# Patient Record
Sex: Male | Born: 2000 | Race: Black or African American | Hispanic: No | Marital: Single | Smoking: Current every day smoker
Health system: Southern US, Community
[De-identification: ages and names within clinical notes are randomized; demographics above are authoritative.]

## PROBLEM LIST (undated history)

## (undated) DIAGNOSIS — J45909 Unspecified asthma, uncomplicated: Secondary | ICD-10-CM

---
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
## 2018-09-22 ENCOUNTER — Emergency Department (HOSPITAL_BASED_OUTPATIENT_CLINIC_OR_DEPARTMENT_OTHER)
Admission: EM | Admit: 2018-09-22 | Discharge: 2018-09-22 | Disposition: A | Discharge status: Home / Self Care | Payer: Medicaid Other | Attending: Emergency Medicine | Admitting: Emergency Medicine

## 2018-09-22 ENCOUNTER — Encounter (HOSPITAL_BASED_OUTPATIENT_CLINIC_OR_DEPARTMENT_OTHER): Payer: Self-pay | Admitting: *Deleted

## 2018-09-22 ENCOUNTER — Other Ambulatory Visit: Payer: Self-pay

## 2018-09-22 ENCOUNTER — Emergency Department (HOSPITAL_BASED_OUTPATIENT_CLINIC_OR_DEPARTMENT_OTHER): Payer: Medicaid Other

## 2018-09-22 DIAGNOSIS — Z79899 Other long term (current) drug therapy: Secondary | ICD-10-CM | POA: Insufficient documentation

## 2018-09-22 DIAGNOSIS — F17200 Nicotine dependence, unspecified, uncomplicated: Secondary | ICD-10-CM | POA: Insufficient documentation

## 2018-09-22 DIAGNOSIS — J45909 Unspecified asthma, uncomplicated: Secondary | ICD-10-CM | POA: Diagnosis not present

## 2018-09-22 DIAGNOSIS — R059 Cough, unspecified: Secondary | ICD-10-CM

## 2018-09-22 DIAGNOSIS — R05 Cough: Secondary | ICD-10-CM | POA: Insufficient documentation

## 2018-09-22 HISTORY — DX: Unspecified asthma, uncomplicated: J45.909

## 2018-09-22 MED ORDER — FLUTICASONE PROPIONATE 50 MCG/ACT NA SUSP: 1.0000 | Freq: Every day | NASAL | 0 refills | Status: AC

## 2018-09-22 MED ORDER — BENZONATATE 100 MG PO CAPS: 100.0000 mg | ORAL_CAPSULE | Freq: Three times a day (TID) | ORAL | 0 refills | Status: AC

## 2018-09-22 NOTE — ED Triage Notes (Signed)
Cough x 2 weeks. Cough is worse at night.

## 2018-09-22 NOTE — Discharge Instructions (Addendum)
You were seen in the emergency today for a cough- your chest xray did not show pneumonia, we suspect your symptoms are related to allergies or a virus at this time.  I have prescribed you multiple medications to treat your symptoms.   -Flonase to be used 1 spray in each nostril daily.  This medication is used to treat your congestion.  -Tessalon can be taken once every 8 hours as needed.  This medication is used to treat your cough.  We have prescribed you new medication(s) today. Discuss the medications prescribed today with your pharmacist as they can have adverse effects and interactions with your other medicines including over the counter and prescribed medications. Seek medical evaluation if you start to experience new or abnormal symptoms after taking one of these medicines, seek care immediately if you start to experience difficulty breathing, feeling of your throat closing, facial swelling, or rash as these could be indications of a more serious allergic reaction  Continue to use your inhaler at home for any wheezing or shortness of breath.  You will need to follow-up with your primary care provider in 1 week if your symptoms have not improved.  If you do not have a primary care provider one is provided in your discharge instructions.  Return to the emergency department for any new or worsening symptoms including but not limited to persistent fever for 5 days, difficulty breathing, chest pain, rashes, passing out, or any other concerns.

## 2018-09-22 NOTE — ED Provider Notes (Signed)
[facility] EMERGENCY DEPARTMENT Provider Note   CSN: [id number] Arrival date & time: 09/22/18  1209     History   Chief Complaint Chief Complaint  Patient presents with  . Cough    HPI Tom Miller is a 17 y.o. male with a hx of asthma and tobacco abuse who presents to the ED with complaints of cough for the past 2 weeks. Patient reports productive cough with clear to green mucous. He states sxs are constant, worse at night, otherwise no specific alleviating/aggravating factors, no intervention tried PTA. He has hx of asthma, denies dyspnea, chest pain, or wheezing, has not had to utilize his inhaler. Denies fever, chills, congestion, ear pain, or sore throat.   HPI  Past Medical History:  Diagnosis Date  . Asthma     There are no active problems to display for this patient.   History reviewed. No pertinent surgical history.      Home Medications    Prior to Admission medications   Medication Sig Start Date End Date Taking? Authorizing Provider  albuterol (PROVENTIL HFA;VENTOLIN HFA) 108 (90 BASE) MCG/ACT inhaler Inhale 2 puffs into the lungs every 6 (six) hours as needed. For shortness of breath   Yes [provider]  Fluticasone-Salmeterol (ADVAIR) 250-50 MCG/DOSE AEPB Inhale 1 puff into the lungs every 12 (twelve) hours.   Yes [provider]  albuterol (PROVENTIL) (2.5 MG/3ML) 0.083% nebulizer solution Take 2.5 mg by nebulization every 6 (six) hours as needed. For shortness of breath    [provider]  fluticasone (FLOVENT HFA) 44 MCG/ACT inhaler Inhale 2 puffs into the lungs 2 (two) times daily.    [provider]  predniSONE (DELTASONE) 10 MG tablet Take 2 tablets (20 mg total) by mouth daily. 06/22/12   Margarita Grizzleay, Danielle, MD    Family History No family history on file.  Social History Social History   Tobacco Use  . Smoking status: Current Every Day Smoker  . Smokeless tobacco: Never Used  Substance Use Topics   . Alcohol use: Never    Frequency: Never  . Drug use: Never     Allergies   Patient has no known allergies.   Review of Systems Review of Systems  Constitutional: Negative for chills and fever.  HENT: Negative for congestion, ear pain and sore throat.   Respiratory: Positive for cough. Negative for choking, chest tightness, shortness of breath and wheezing.   Cardiovascular: Negative for chest pain.  Gastrointestinal: Negative for nausea and vomiting.     Physical Exam Updated Vital Signs BP 131/79   Pulse 64   Temp 98.4 F (36.9 C) (Oral)   Resp 20   Ht 5\' 7"  (1.702 m)   Wt 77.1 kg   SpO2 98%   BMI 26.63 kg/m   Physical Exam  Constitutional: He appears well-developed and well-nourished.  Non-toxic appearance. No distress.  HENT:  Head: Normocephalic and atraumatic.  Right Ear: Tympanic membrane and ear canal normal. Tympanic membrane is not perforated, not erythematous, not retracted and not bulging.  Left Ear: Tympanic membrane and ear canal normal. Tympanic membrane is not perforated, not erythematous, not retracted and not bulging.  Nose: Nose normal.  Mouth/Throat: Uvula is midline and oropharynx is clear and moist. No oropharyngeal exudate or posterior oropharyngeal erythema.  Eyes: Conjunctivae are normal. Right eye exhibits no discharge. Left eye exhibits no discharge.  Neck: Normal range of motion. Neck supple. No neck rigidity.  Cardiovascular: Normal rate and regular rhythm.  Pulmonary/Chest: Effort  normal and breath sounds normal. No stridor. No respiratory distress. He has no wheezes. He has no rhonchi. He has no rales.  Respiration even and unlabored  Abdominal: Soft. He exhibits no distension. There is no tenderness.  Lymphadenopathy:    He has no cervical adenopathy.  Neurological: He is alert.  Clear speech.   Skin: Skin is warm and dry. No rash noted.  Psychiatric: He has a normal mood and affect. His behavior is normal. Thought content normal.    Nursing note and vitals reviewed.    ED Treatments / Results  Labs (all labs ordered are listed, but only abnormal results are displayed) Labs Reviewed - No data to display  EKG None  Radiology Dg Chest 2 View  Result Date: 09/22/2018 CLINICAL DATA:  Two weeks of initially nonproductive cough which has become productive. History of asthma, current smoker. EXAM: CHEST - 2 VIEW COMPARISON:  Report of a chest x-ray of January 16, 2010 FINDINGS: The lungs are well-expanded. There is mild increase in the AP dimension of the thorax. The heart and pulmonary vascularity are normal. There is no pleural effusion. The mediastinum is normal in width. The bony thorax exhibits no acute abnormality. IMPRESSION: Mild hyperinflation consistent with reactive airway disease. No pneumonia nor other acute cardiopulmonary abnormality. Electronically Signed   By: David  [place] M.D.   On: 09/22/2018 13:05    Procedures Procedures (including critical care time)  Medications Ordered in ED Medications - No data to display   Initial Impression / Assessment and Plan / ED Course  I have reviewed the triage vital signs and the nursing notes.  Pertinent labs & imaging results that were available during my care of the patient were reviewed by me and considered in my medical decision making (see chart for details).   Patient presents to the ED with complaints of cough x 2 weeks. Patient nontoxic appearing, in no apparent distress, vitals WNL. On exam patient's lungs are clear to auscultation bilaterally and he does not appear to be in any respiratory distress. CXR obtained reveals findings consistent with reactive airway disease- no evidence of pneumonia, effusion, edema, or pneumothorax. Patient is not wheezing on my assessment, do not feel this is a true asthma exacerbation requiring steroids. Suspect viral vs. Allergic. Will provide prescription for tessalon and for flonase with use of inhaler PRN and PCP follow up.  I discussed results, treatment plan, need for follow-up, and return precautions with the patient and his grandmother at bedside. Provided opportunity for questions, patient and his grandmother confirmed understanding and are in agreement with plan.   Final Clinical Impressions(s) / ED Diagnoses   Final diagnoses:  Cough    ED Discharge Orders         Ordered    fluticasone (FLONASE) 50 MCG/ACT nasal spray  Daily     09/22/18 1325    benzonatate (TESSALON) 100 MG capsule  Every 8 hours     09/22/18 1325           Petrucelli, [place] R, PA-C 09/22/18 1326    Tegeler, Canary Brim, MD 09/22/18 [phone]

## 2018-10-20 ENCOUNTER — Encounter (HOSPITAL_BASED_OUTPATIENT_CLINIC_OR_DEPARTMENT_OTHER): Payer: Self-pay | Admitting: *Deleted

## 2018-10-20 ENCOUNTER — Emergency Department (HOSPITAL_BASED_OUTPATIENT_CLINIC_OR_DEPARTMENT_OTHER)
Admission: EM | Admit: 2018-10-20 | Discharge: 2018-10-20 | Disposition: A | Discharge status: Home / Self Care | Payer: Medicaid Other | Attending: Emergency Medicine | Admitting: Emergency Medicine

## 2018-10-20 ENCOUNTER — Emergency Department (HOSPITAL_BASED_OUTPATIENT_CLINIC_OR_DEPARTMENT_OTHER): Payer: Medicaid Other

## 2018-10-20 ENCOUNTER — Other Ambulatory Visit: Payer: Self-pay

## 2018-10-20 DIAGNOSIS — Z79899 Other long term (current) drug therapy: Secondary | ICD-10-CM | POA: Diagnosis not present

## 2018-10-20 DIAGNOSIS — J45901 Unspecified asthma with (acute) exacerbation: Secondary | ICD-10-CM | POA: Insufficient documentation

## 2018-10-20 DIAGNOSIS — J181 Lobar pneumonia, unspecified organism: Secondary | ICD-10-CM | POA: Insufficient documentation

## 2018-10-20 DIAGNOSIS — F17210 Nicotine dependence, cigarettes, uncomplicated: Secondary | ICD-10-CM | POA: Insufficient documentation

## 2018-10-20 DIAGNOSIS — J189 Pneumonia, unspecified organism: Secondary | ICD-10-CM

## 2018-10-20 DIAGNOSIS — R062 Wheezing: Secondary | ICD-10-CM | POA: Diagnosis present

## 2018-10-20 MED ORDER — PREDNISONE 50 MG PO TABS
60.0000 mg | ORAL_TABLET | Freq: Once | ORAL | Status: AC
  Administered 2018-10-20: 60 mg via ORAL
  Filled 2018-10-20: qty 1

## 2018-10-20 MED ORDER — IPRATROPIUM-ALBUTEROL 0.5-2.5 (3) MG/3ML IN SOLN
RESPIRATORY_TRACT | Status: AC
  Administered 2018-10-20: 3 mL
  Filled 2018-10-20: qty 3

## 2018-10-20 MED ORDER — ALBUTEROL SULFATE HFA 108 (90 BASE) MCG/ACT IN AERS
2.0000 | INHALATION_SPRAY | Freq: Once | RESPIRATORY_TRACT | Status: AC
  Administered 2018-10-20: 2 via RESPIRATORY_TRACT
  Filled 2018-10-20: qty 6.7

## 2018-10-20 MED ORDER — DOXYCYCLINE HYCLATE 100 MG PO TABS
100.0000 mg | ORAL_TABLET | Freq: Once | ORAL | Status: AC
  Administered 2018-10-20: 100 mg via ORAL
  Filled 2018-10-20: qty 1

## 2018-10-20 MED ORDER — DOXYCYCLINE HYCLATE 100 MG PO CAPS: 100.0000 mg | ORAL_CAPSULE | Freq: Two times a day (BID) | ORAL | 0 refills | Status: AC

## 2018-10-20 MED ORDER — ALBUTEROL SULFATE (2.5 MG/3ML) 0.083% IN NEBU
INHALATION_SOLUTION | RESPIRATORY_TRACT | Status: AC
  Administered 2018-10-20: 2.5 mg
  Filled 2018-10-20: qty 3

## 2018-10-20 NOTE — ED Triage Notes (Signed)
Wheezing. Hx of asthma. He ran out of his inhaler months ago per pt. Cough.

## 2018-10-20 NOTE — Discharge Instructions (Addendum)
Take your antibiotic as prescribed until it is completely gone. Use the albuterol every 4-6 hours as needed for shortness of breath or wheezing. Follow-up with your primary care provider. Return to the ER for new or worsening symptoms.

## 2018-10-20 NOTE — ED Provider Notes (Signed)
[facility] EMERGENCY DEPARTMENT Provider Note   CSN: [id number] Arrival date & time: 10/20/18  1633     History   Chief Complaint Chief Complaint  Patient presents with  . Asthma    HPI Tom Miller is a 17 y.o. male w PMHx asthma, presenting to the ED with complaint of wheezing that began today.  Patient states he has been getting over a cold for 1 month, symptoms consist of cough productive of white sputum as well as nasal congestion.  States that is causing his asthma to act up.  Intermittently treating his symptoms with Flonase as prescribed during previous ED visit on 09/22/2018.  He is takes Advair twice daily for his asthma, however has been out of albuterol for a couple of months now.  No fevers, sore throat, ear pain, difficulty swallowing.  He has not tried antihistamines.  The history is provided by the patient.    Past Medical History:  Diagnosis Date  . Asthma     There are no active problems to display for this patient.   History reviewed. No pertinent surgical history.      Home Medications    Prior to Admission medications   Medication Sig Start Date End Date Taking? Authorizing Provider  albuterol (PROVENTIL HFA;VENTOLIN HFA) 108 (90 BASE) MCG/ACT inhaler Inhale 2 puffs into the lungs every 6 (six) hours as needed. For shortness of breath    [provider]  benzonatate (TESSALON) 100 MG capsule Take 1 capsule (100 mg total) by mouth every 8 (eight) hours. 09/22/18   Petrucelli, Samantha R, PA-C  doxycycline (VIBRAMYCIN) 100 MG capsule Take 1 capsule (100 mg total) by mouth 2 (two) times daily for 7 days. 10/20/18 10/27/18  Alic Hilburn, [place] N, PA-C  fluticasone (FLONASE) 50 MCG/ACT nasal spray Place 1 spray into both nostrils daily. 09/22/18   Petrucelli, Samantha R, PA-C  Fluticasone-Salmeterol (ADVAIR) 250-50 MCG/DOSE AEPB Inhale 1 puff into the lungs every 12 (twelve) hours.    [provider]    Family History No  family history on file.  Social History Social History   Tobacco Use  . Smoking status: Current Every Day Smoker  . Smokeless tobacco: Never Used  Substance Use Topics  . Alcohol use: Never    Frequency: Never  . Drug use: Never     Allergies   Patient has no known allergies.   Review of Systems Review of Systems  Constitutional: Negative for fever.  HENT: Positive for congestion. Negative for ear pain, sore throat and trouble swallowing.   Respiratory: Positive for cough and wheezing.      Physical Exam Updated Vital Signs BP 129/76   Pulse 94   Temp 98.8 F (37.1 C) (Oral)   Resp 20   Ht 5\' 7"  (1.702 m)   Wt 77.1 kg   SpO2 97%   BMI 26.62 kg/m   Physical Exam Vitals signs and nursing note reviewed.  Constitutional:      Appearance: He is well-developed.  HENT:     Head: Normocephalic and atraumatic.     Right Ear: Tympanic membrane and ear canal normal.     Left Ear: Tympanic membrane and ear canal normal.     Nose: Congestion present.     Mouth/Throat:     Mouth: Mucous membranes are moist.     Pharynx: Oropharynx is clear.  Eyes:     Conjunctiva/sclera: Conjunctivae normal.  Neck:     Musculoskeletal: Normal range of motion and neck supple.  Cardiovascular:     Rate and Rhythm: Normal rate and regular rhythm.  Pulmonary:     Effort: Pulmonary effort is normal.     Comments: Mild expiratory wheezes throughout, some rhonchi LLL. Normal work of breathing. Speaking in full sentences Lymphadenopathy:     Cervical: No cervical adenopathy.  Neurological:     Mental Status: He is alert.  Psychiatric:        Mood and Affect: Mood normal.        Behavior: Behavior normal.      ED Treatments / Results  Labs (all labs ordered are listed, but only abnormal results are displayed) Labs Reviewed - No data to display  EKG None  Radiology Dg Chest 2 View  Result Date: 10/20/2018 CLINICAL DATA:  Initial evaluation for acute cough, congestion. EXAM:  CHEST - 2 VIEW COMPARISON:  Prior radiograph from 09/22/2018. FINDINGS: Cardiac and mediastinal silhouettes within normal limits. Lungs well inflated bilaterally. Hazy right lower infiltrate, suspicious for pneumonia. No other focal airspace disease. No edema or effusion. No pneumothorax. No acute osseous abnormality. IMPRESSION: Hazy right lower lobe infiltrate, suspicious for pneumonia. Electronically Signed   By: Rise Mu M.D.   On: 10/20/2018 17:49    Procedures Procedures (including critical care time)  Medications Ordered in ED Medications  doxycycline (VIBRA-TABS) tablet 100 mg (has no administration in time range)  albuterol (PROVENTIL HFA;VENTOLIN HFA) 108 (90 Base) MCG/ACT inhaler 2 puff (has no administration in time range)  albuterol (PROVENTIL) (2.5 MG/3ML) 0.083% nebulizer solution (2.5 mg  Given 10/20/18 1650)  ipratropium-albuterol (DUONEB) 0.5-2.5 (3) MG/3ML nebulizer solution (3 mLs  Given 10/20/18 1650)  predniSONE (DELTASONE) tablet 60 mg (60 mg Oral Given 10/20/18 1728)     Initial Impression / Assessment and Plan / ED Course  I have reviewed the triage vital signs and the nursing notes.  Pertinent labs & imaging results that were available during my care of the patient were reviewed by me and considered in my medical decision making (see chart for details).  Clinical Course as of Oct 20 1820  Fri Oct 20, 2018  1811 Suspicious for pneumonia. Will treat with doxycycline.  DG Chest 2 View [JR]    Clinical Course User Index [JR] Brycen Bean, [place] N, PA-C    Patient w hx of asthma, presenting with wheezing that began today, and cold sx for 1 month. Pt treated prior to evaluation by RT, with duoneb with improvement. States he is breathing at his baseline and no longer feels short of breath. Oxygenating at 97% on room air, normal work of breathing, speaking in full sentences.  Lungs with wheezes as well as rhonchi in the left, however CXR suspicious for  infiltrate in the RLL. ENT exam is unremarkable. Pt is not ill appearing, immunocompromised, and does not have multiple co morbidities, therefore I feel like the they can be treated as an OP with abx therapy. Pt has been advised to return to the ED if symptoms worsen or they do not improve. Pt verbalizes understanding and is agreeable with plan.   Discussed results, findings, treatment and follow up. Patient advised of return precautions. Patient verbalized understanding and agreed with plan.  Final Clinical Impressions(s) / ED Diagnoses   Final diagnoses:  Community acquired pneumonia of right lower lobe of lung (HCC)  Asthma with acute exacerbation, unspecified asthma severity, unspecified whether persistent    ED Discharge Orders         Ordered    doxycycline (VIBRAMYCIN) 100 MG  capsule  2 times daily     10/20/18 1821           Rhonda Linan, [place] N, [place]PA-C 10/20/18 1821    Sabas SousBero, Michael M, MD 10/20/18 [id number]

## 2020-12-11 IMAGING — CR DG CHEST 2V
2 series · 2 of 2 positions shown · non-contrast
Comparison: Prior radiograph from 09/22/2018.

CLINICAL DATA: Initial evaluation for acute cough, congestion.

EXAM:
CHEST - 2 VIEW

[w chest pa]
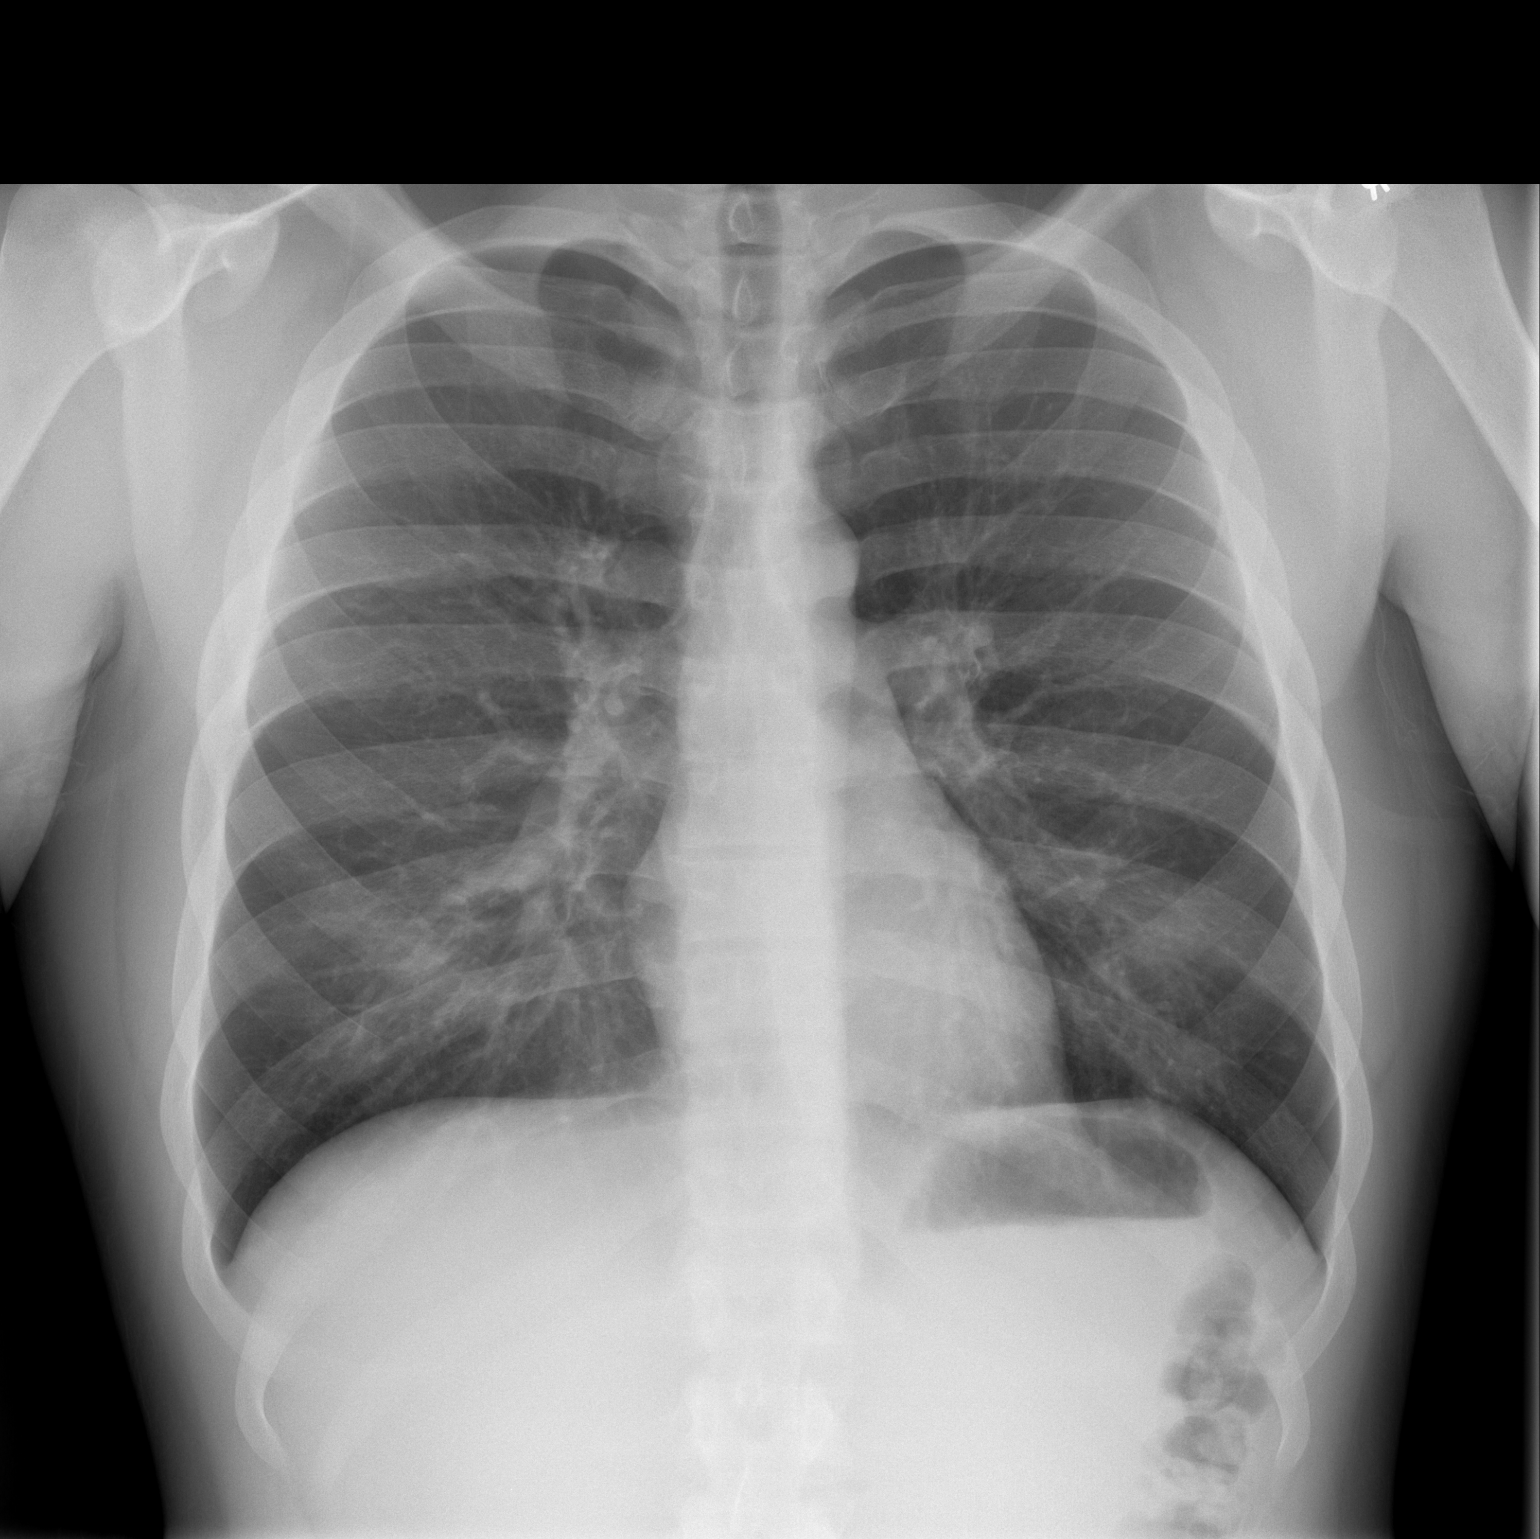

[w chest lat]
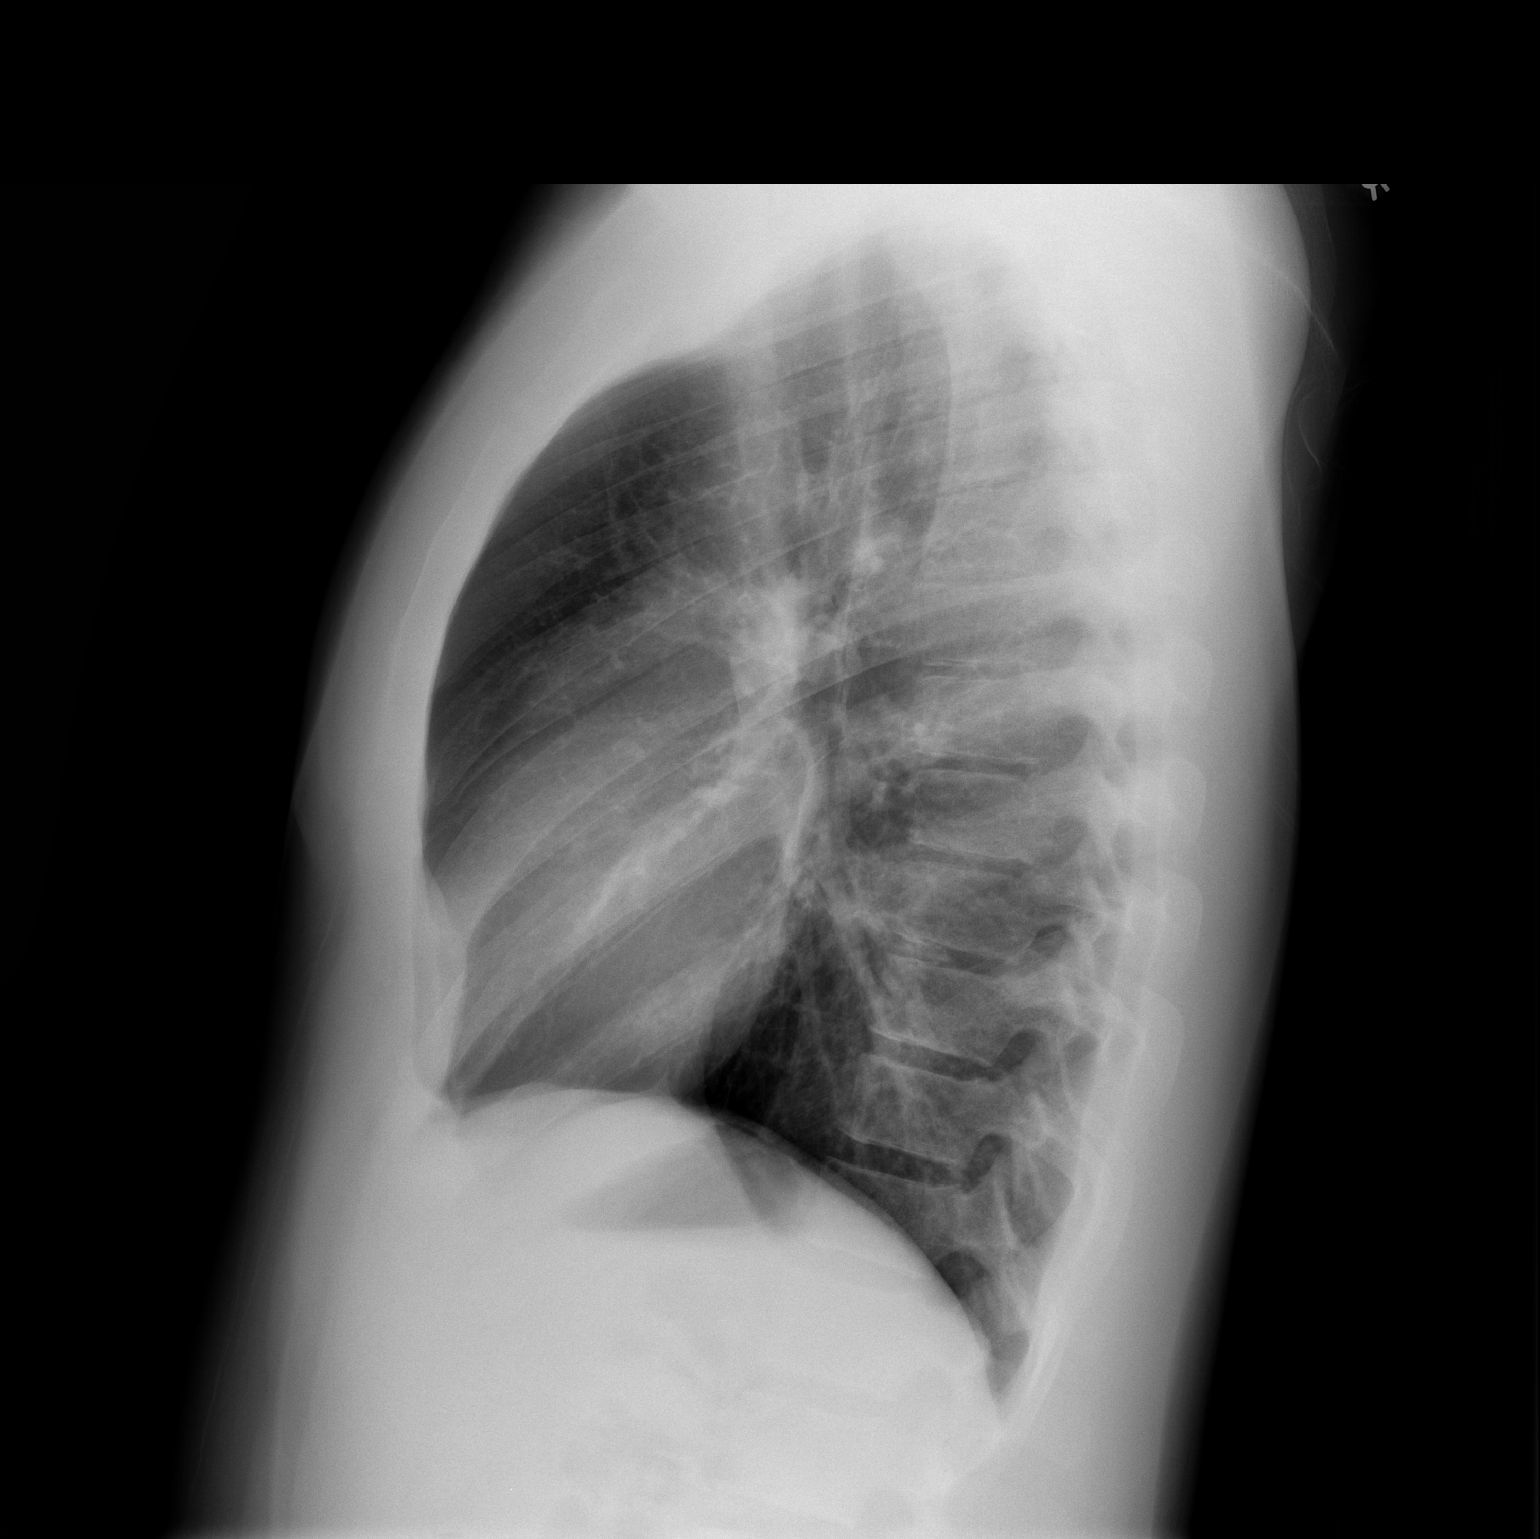

[2 of 2 positions shown; findings below may reference images not displayed]

FINDINGS: Cardiac and mediastinal silhouettes within normal limits.

Lungs well inflated bilaterally. Hazy right lower infiltrate,
suspicious for pneumonia. No other focal airspace disease. No edema
or effusion. No pneumothorax.

No acute osseous abnormality.
IMPRESSION: Hazy right lower lobe infiltrate, suspicious for pneumonia.
